# Patient Record
Sex: Male | Born: 1960 | Race: Black or African American | Hispanic: No | Marital: Married | Smoking: Never smoker
Health system: Southern US, Community
[De-identification: ages and names within clinical notes are randomized; demographics above are authoritative.]

---

## 2012-09-30 ENCOUNTER — Observation Stay (HOSPITAL_COMMUNITY): Payer: BC Managed Care – PPO

## 2012-09-30 ENCOUNTER — Emergency Department (HOSPITAL_BASED_OUTPATIENT_CLINIC_OR_DEPARTMENT_OTHER): Payer: BC Managed Care – PPO

## 2012-09-30 ENCOUNTER — Observation Stay (HOSPITAL_BASED_OUTPATIENT_CLINIC_OR_DEPARTMENT_OTHER)
Admission: EM | Admit: 2012-09-30 | Discharge: 2012-10-01 | Disposition: A | Discharge status: Home / Self Care | Payer: BC Managed Care – PPO | Attending: Internal Medicine | Admitting: Internal Medicine

## 2012-09-30 ENCOUNTER — Encounter (HOSPITAL_BASED_OUTPATIENT_CLINIC_OR_DEPARTMENT_OTHER): Payer: Self-pay | Admitting: Emergency Medicine

## 2012-09-30 DIAGNOSIS — S2020XA Contusion of thorax, unspecified, initial encounter: Secondary | ICD-10-CM

## 2012-09-30 DIAGNOSIS — T148XXA Other injury of unspecified body region, initial encounter: Secondary | ICD-10-CM

## 2012-09-30 DIAGNOSIS — S20229S Contusion of unspecified back wall of thorax, sequela: Secondary | ICD-10-CM

## 2012-09-30 DIAGNOSIS — X500XXA Overexertion from strenuous movement or load, initial encounter: Secondary | ICD-10-CM | POA: Insufficient documentation

## 2012-09-30 DIAGNOSIS — R109 Unspecified abdominal pain: Secondary | ICD-10-CM

## 2012-09-30 DIAGNOSIS — S20229A Contusion of unspecified back wall of thorax, initial encounter: Principal | ICD-10-CM | POA: Insufficient documentation

## 2012-09-30 LAB — PROTIME-INR
INR: 1.04 (ref 0.00–1.49)
Prothrombin Time: 13.4 s (ref 11.6–15.2)

## 2012-09-30 LAB — CBC WITH DIFFERENTIAL/PLATELET
Basophils Absolute: 0 10*3/uL (ref 0.0–0.1)
Basophils Relative: 0 % (ref 0–1)
Eosinophils Absolute: 0.5 10*3/uL (ref 0.0–0.7)
Eosinophils Relative: 6 % — ABNORMAL HIGH (ref 0–5)
HCT: 45.5 % (ref 39.0–52.0)
Lymphs Abs: 2 10*3/uL (ref 0.7–4.0)
MCH: 28.7 pg (ref 26.0–34.0)
MCHC: 34.3 g/dL (ref 30.0–36.0)
MCV: 83.8 fL (ref 78.0–100.0)
Monocytes Absolute: 0.8 10*3/uL (ref 0.1–1.0)
Monocytes Relative: 9 % (ref 3–12)
Neutro Abs: 5.5 10*3/uL (ref 1.7–7.7)
Neutrophils Relative %: 62 % (ref 43–77)
RBC: 5.43 MIL/uL (ref 4.22–5.81)
RDW: 12.7 % (ref 11.5–15.5)

## 2012-09-30 LAB — BASIC METABOLIC PANEL
BUN: 13 mg/dL (ref 6–23)
CO2: 26 mEq/L (ref 19–32)
Calcium: 9.3 mg/dL (ref 8.4–10.5)
Creatinine, Ser: 1.1 mg/dL (ref 0.50–1.35)
GFR calc Af Amer: 88 mL/min — ABNORMAL LOW (ref >90)
GFR calc non Af Amer: 76 mL/min — ABNORMAL LOW (ref >90)
Glucose, Bld: 124 mg/dL — ABNORMAL HIGH (ref 70–99)
Potassium: 3.9 mEq/L (ref 3.5–5.1)

## 2012-09-30 LAB — URINALYSIS, ROUTINE W REFLEX MICROSCOPIC
Bilirubin Urine: NEGATIVE
Glucose, UA: NEGATIVE mg/dL
Ketones, ur: NEGATIVE mg/dL
Leukocytes, UA: NEGATIVE
Protein, ur: NEGATIVE mg/dL
Specific Gravity, Urine: 1.022 (ref 1.005–1.030)
pH: 6.5 (ref 5.0–8.0)

## 2012-09-30 LAB — APTT: aPTT: 29 s (ref 24–37)

## 2012-09-30 LAB — HEPATIC FUNCTION PANEL
Alkaline Phosphatase: 101 U/L (ref 39–117)
Bilirubin, Direct: 0.1 mg/dL (ref 0.0–0.3)
Indirect Bilirubin: 0.6 mg/dL (ref 0.3–0.9)
Total Protein: 6.9 g/dL (ref 6.0–8.3)

## 2012-09-30 LAB — LIPASE, BLOOD: Lipase: 15 U/L (ref 11–59)

## 2012-09-30 MED ORDER — CALCIUM CARBONATE ANTACID 500 MG PO CHEW
1.0000 | CHEWABLE_TABLET | Freq: Every day | ORAL | Status: DC
  Administered 2012-09-30 – 2012-10-01 (×2): 200 mg via ORAL
  Filled 2012-09-30 (×2): qty 1

## 2012-09-30 MED ORDER — POLYETHYLENE GLYCOL 3350 17 G PO PACK: 17.0000 g | PACK | Freq: Every day | ORAL | Status: DC | PRN

## 2012-09-30 MED ORDER — ONDANSETRON HCL 4 MG/2ML IJ SOLN: 4.0000 mg | Freq: Four times a day (QID) | INTRAMUSCULAR | Status: DC | PRN

## 2012-09-30 MED ORDER — HYDROMORPHONE HCL PF 1 MG/ML IJ SOLN
1.0000 mg | Freq: Once | INTRAMUSCULAR | Status: AC
  Administered 2012-09-30: 1 mg via INTRAVENOUS
  Filled 2012-09-30: qty 1

## 2012-09-30 MED ORDER — SORBITOL 70 % SOLN
30.0000 mL | Freq: Every day | Status: DC | PRN
  Filled 2012-09-30: qty 30

## 2012-09-30 MED ORDER — SODIUM CHLORIDE 0.9 % IV BOLUS (SEPSIS)
1000.0000 mL | Freq: Once | INTRAVENOUS | Status: AC
  Administered 2012-09-30: 1000 mL via INTRAVENOUS

## 2012-09-30 MED ORDER — MORPHINE SULFATE 2 MG/ML IJ SOLN
2.0000 mg | INTRAMUSCULAR | Status: DC | PRN
  Administered 2012-09-30: 2 mg via INTRAVENOUS
  Filled 2012-09-30: qty 1

## 2012-09-30 MED ORDER — MORPHINE SULFATE 4 MG/ML IJ SOLN
4.0000 mg | Freq: Once | INTRAMUSCULAR | Status: AC
  Administered 2012-09-30: 4 mg via INTRAVENOUS
  Filled 2012-09-30: qty 1

## 2012-09-30 MED ORDER — GADOBENATE DIMEGLUMINE 529 MG/ML IV SOLN
20.0000 mL | Freq: Once | INTRAVENOUS | Status: AC | PRN
  Administered 2012-09-30: 20 mL via INTRAVENOUS

## 2012-09-30 MED ORDER — ACETAMINOPHEN 325 MG PO TABS: 650.0000 mg | ORAL_TABLET | Freq: Four times a day (QID) | ORAL | Status: DC | PRN

## 2012-09-30 MED ORDER — MORPHINE SULFATE 4 MG/ML IJ SOLN
INTRAMUSCULAR | Status: AC
  Filled 2012-09-30: qty 1

## 2012-09-30 MED ORDER — ACETAMINOPHEN 650 MG RE SUPP: 650.0000 mg | Freq: Four times a day (QID) | RECTAL | Status: DC | PRN

## 2012-09-30 MED ORDER — MORPHINE SULFATE 4 MG/ML IJ SOLN
4.0000 mg | Freq: Once | INTRAMUSCULAR | Status: AC
  Administered 2012-09-30: 4 mg via INTRAVENOUS

## 2012-09-30 MED ORDER — IOHEXOL 300 MG/ML  SOLN
80.0000 mL | Freq: Once | INTRAMUSCULAR | Status: AC | PRN
  Administered 2012-09-30: 80 mL via INTRAVENOUS

## 2012-09-30 MED ORDER — ONDANSETRON HCL 4 MG PO TABS: 4.0000 mg | ORAL_TABLET | Freq: Four times a day (QID) | ORAL | Status: DC | PRN

## 2012-09-30 NOTE — ED Provider Notes (Signed)
CSN: [id number]     Arrival date & time 09/30/12  0125 History   First MD Initiated Contact with Patient 09/30/12 0159     Chief Complaint  Patient presents with  . Abdominal Pain   (Consider location/radiation/quality/duration/timing/severity/associated sxs/prior Treatment) Patient is a 52 y.o. male presenting with abdominal pain. The history is provided by the patient.  Abdominal Pain Pain location:  Generalized Pain quality: aching   Pain radiates to:  Does not radiate Pain severity:  Severe Onset quality:  Gradual Duration:  1 week Progression:  Worsening Context: not alcohol use and not eating   Relieved by:  Nothing Worsened by:  Nothing tried Ineffective treatments:  None tried Associated symptoms: no anorexia, no chest pain, no constipation, no diarrhea and no vomiting   Risk factors: no recent hospitalization     History reviewed. No pertinent past medical history. History reviewed. No pertinent past surgical history. History reviewed. No pertinent family history. History  Substance Use Topics  . Smoking status: Never Smoker   . Smokeless tobacco: Not on file  . Alcohol Use: No    Review of Systems  Cardiovascular: Negative for chest pain.  Gastrointestinal: Positive for abdominal pain. Negative for vomiting, diarrhea, constipation and anorexia.  Genitourinary: Positive for flank pain.  All other systems reviewed and are negative.    Allergies  Review of patient's allergies indicates no known allergies.  Home Medications   Current Outpatient Rx  Name  Route  Sig  Dispense  Refill  . calcium carbonate (TUMS - DOSED IN MG ELEMENTAL CALCIUM) 500 MG chewable tablet   Oral   Chew 1 tablet by mouth daily.          BP 111/91  Pulse 89  Temp(Src) 98.7 F (37.1 C) (Oral)  Resp 20  Ht 5\' 10"  (1.778 m)  Wt 192 lb (87.091 kg)  BMI 27.55 kg/m2  SpO2 100% Physical Exam  Constitutional: He is oriented to person, place, and time. He appears well-developed  and well-nourished. No distress.  HENT:  Head: Normocephalic and atraumatic.  Mouth/Throat: Oropharynx is clear and moist.  Eyes: Conjunctivae are normal. Pupils are equal, round, and reactive to light.  Neck: Normal range of motion. Neck supple.  Cardiovascular: Normal rate, regular rhythm and intact distal pulses.   Pulmonary/Chest: Effort normal and breath sounds normal. No respiratory distress. He has no wheezes. He has no rales.  Abdominal: Soft. Bowel sounds are normal. He exhibits distension. There is tenderness. There is no rebound and no guarding.  Musculoskeletal: Normal range of motion.  Neurological: He is alert and oriented to person, place, and time.  Skin: Skin is warm and dry.  Psychiatric: He has a normal mood and affect.    ED Course  Procedures (including critical care time) Labs Review Labs Reviewed  URINALYSIS, ROUTINE W REFLEX MICROSCOPIC - Abnormal; Notable for the following:    APPearance CLOUDY (*)    All other components within normal limits  CBC WITH DIFFERENTIAL - Abnormal; Notable for the following:    Eosinophils Relative 6 (*)    All other components within normal limits  BASIC METABOLIC PANEL - Abnormal; Notable for the following:    Glucose, Bld 124 (*)    GFR calc non Af Amer 76 (*)    GFR calc Af Amer 88 (*)    All other components within normal limits  HEPATIC FUNCTION PANEL  LIPASE, BLOOD   Imaging Review Ct Abdomen Pelvis Wo Contrast  09/30/2012   CLINICAL DATA:  Right abdominal pain.  EXAM: CT ABDOMEN AND PELVIS WITHOUT CONTRAST  TECHNIQUE: Multidetector CT imaging of the abdomen and pelvis was performed following the standard protocol without intravenous contrast.  COMPARISON:  None.  FINDINGS: Dependent atelectasis posteriorly in the right lung base. Abnormal hyperdense soft tissue anterior to all levels of the visualized lower thoracic spine extending into the retrocrural region of the upper abdomen. There is a minimal usual anterior  spurring in the lower thoracic spine without evident disk pathology or other etiology of the above findings.  Unremarkable nonenhanced assessment of liver, spleen, adrenal glands, kidneys, pancreas, abdominal aorta. Stomach, small bowel, and colon unremarkable, nondilated. Normal appendix. Urinary bladder is nondistended. There is mild prostatic enlargement. There is lobular enlargement of the seminal vesicles right greater than left. Bilateral pelvic phleboliths. No ascites. No free air.  IMPRESSION: Extensive hyperdense thoracic prevertebral material possibly hematoma. Abscess, lymphoma, or TB considered less likely. Recommend CT chest with contrast to evaluate the complete extent of this process.   Electronically Signed   By: Oley Balm M.D.   On: 09/30/2012 03:35    MDM  No diagnosis found.  Thoracic hematoma  Case d/w Dr. Dorris Fetch, CVTS. admit to medicine.    Jasmine Awe, MD 09/30/12 [id number]

## 2012-09-30 NOTE — H&P (Signed)
[facility] History and Physical  Eric Hubbard ZOX:[id number] DOB: 1960-07-13 DOA: 09/30/2012  Referring physician: Dr. Nicanor Alcon PCP: Lester Cobre, MD  Specialists:   Chief Complaint: Abdominal pain  HPI: Eric Hubbard is a 52 y.o. male  With no significant pmh who initially presented to [facility] with complaints of abdominal pain. During his work up, the patient was noted to have CT findings suggestive of a thoracic hematoma extending from T3 through T12. He was subsequently transferred to [facility] for further evaluation. On further questioning, the patient recalled hurting his lower back while lifting an 80 lb equipment by himself at work 2 weeks ago. Lower back pain has since resolved. Pt also regularly participates in resistance training.  Review of Systems:  Per above, remainder of 10pt ros reviewed and are neg  History reviewed. No pertinent past medical history. History reviewed. No pertinent past surgical history. Social History:  reports that he has never smoked. He does not have any smokeless tobacco history on file. He reports that he does not drink alcohol. His drug history is not on file.  where does patient live--home, ALF, SNF? and with whom if at home?  Can patient participate in ADLs?  No Known Allergies  History reviewed. No pertinent family history.  (be sure to complete)  Prior to Admission medications   Medication Sig Start Date End Date Taking? Authorizing Provider  calcium carbonate (TUMS - DOSED IN MG ELEMENTAL CALCIUM) 500 MG chewable tablet Chew 1 tablet by mouth daily.   Yes Historical Provider, MD   Physical Exam: Filed Vitals:   09/30/12 0132 09/30/12 0624  BP: 111/91 126/81  Pulse: 89 87  Temp: 98.7 F (37.1 C) 97.9 F (36.6 C)  TempSrc: Oral Oral  Resp: 20 18  Height: 5\' 10"  (1.778 m)   Weight: 87.091 kg (192 lb)   SpO2: 100% 100%     General:  Awake, in nad  Eyes: PERRL B  ENT: membranes moist, dentition fair  Neck: trachea  midline, neck supple  Cardiovascular: regular, s1, s2  Respiratory: normal resp effort, no wheezing  Abdomen: soft, nondistended  Skin: normal skin turgor, no abnormal skin lesions seen  Musculoskeletal: perfused, no clubbing  Psychiatric: mood/affect normal // no auditory/visual hallucinations  Neurologic: cn2-12 grossly intact, strength/sensation intact  Labs on Admission:  Basic Metabolic Panel:  Recent Labs Lab 09/30/12 0155  NA 139  K 3.9  CL 105  CO2 26  GLUCOSE 124*  BUN 13  CREATININE 1.10  CALCIUM 9.3   Liver Function Tests:  Recent Labs Lab 09/30/12 0155  AST 13  ALT 14  ALKPHOS 101  BILITOT 0.7  PROT 6.9  ALBUMIN 3.7    Recent Labs Lab 09/30/12 0155  LIPASE 15   No results found for this basename: AMMONIA,  in the last 168 hours CBC:  Recent Labs Lab 09/30/12 0155  WBC 8.8  NEUTROABS 5.5  HGB 15.6  HCT 45.5  MCV 83.8  PLT 218   Cardiac Enzymes: No results found for this basename: CKTOTAL, CKMB, CKMBINDEX, TROPONINI,  in the last 168 hours  BNP (last 3 results) No results found for this basename: PROBNP,  in the last 8760 hours CBG: No results found for this basename: GLUCAP,  in the last 168 hours  Radiological Exams on Admission: Ct Abdomen Pelvis Wo Contrast  09/30/2012   CLINICAL DATA:  Right abdominal pain.  EXAM: CT ABDOMEN AND PELVIS WITHOUT CONTRAST  TECHNIQUE: Multidetector CT imaging of the abdomen and pelvis was  performed following the standard protocol without intravenous contrast.  COMPARISON:  None.  FINDINGS: Dependent atelectasis posteriorly in the right lung base. Abnormal hyperdense soft tissue anterior to all levels of the visualized lower thoracic spine extending into the retrocrural region of the upper abdomen. There is a minimal usual anterior spurring in the lower thoracic spine without evident disk pathology or other etiology of the above findings.  Unremarkable nonenhanced assessment of liver, spleen, adrenal  glands, kidneys, pancreas, abdominal aorta. Stomach, small bowel, and colon unremarkable, nondilated. Normal appendix. Urinary bladder is nondistended. There is mild prostatic enlargement. There is lobular enlargement of the seminal vesicles right greater than left. Bilateral pelvic phleboliths. No ascites. No free air.  IMPRESSION: Extensive hyperdense thoracic prevertebral material possibly hematoma. Abscess, lymphoma, or TB considered less likely. Recommend CT chest with contrast to evaluate the complete extent of this process.   Electronically Signed   By: Oley Balm M.D.   On: 09/30/2012 03:35   Ct Chest W Contrast  09/30/2012   CLINICAL DATA:  Right-sided abdominal pain. Prevertebral soft tissue density on abdominal scan, incompletely visualized.  EXAM: CT CHEST WITH CONTRAST  TECHNIQUE: Multidetector CT imaging of the chest was performed during intravenous contrast administration.  CONTRAST:  80 mL Omnipaque 300 IV  COMPARISON:  Abdomen scan from earlier the same day  FINDINGS: Abnormal prevertebral soft tissue attenuation material extends from the T3 level to approximately T10, and retrocrural extension is seen to the T12 level. The collection is centered at the T6-7 level, measuring approximately 18 mm in thickness anterior to the interspace.  Adequate contrast opacification of the thoracic aorta with no evidence of dissection, aneurysm, or stenosis. There is classic 3-vessel brachiocephalic arch anatomy without proximal stenosis. No significant atheromatous plaque or penetrating ulcer. No evidence of branch vessel aneurysm or active extravasation.  Thoracic discs are normal in height throughout. There are some small anterior endplate spurs in the mid thoracic spine. No thoracic vertebral lesion is evident. Esophagus appears normal in caliber, nondistended, although its margins are difficult to as delineated at the level of the collection.  Subcentimeter prevascular and precarinal lymph nodes. No  hilar adenopathy. There is a trace left pleural effusion. Minimal dependent atelectasis posteriorly in both lower lobes. Lungs otherwise clear. An 18 mm low-attenuation right thyroid nodule or cyst is incidentally noted.  IMPRESSION: 1. High attenuation thoracic prevertebral collection extending from T3-T12, probably hematoma, without associated vascular lesion, mass, or other evident etiology. Lymphoma, extramedullary hematopoiesis, and infectious/inflammatory phlegmon are less likely differential considerations. At minimum, consider followup to confirm appropriate resolution. MR thoracic spine with contrast may be useful for further characterization and to exclude underlying mass as source of hemorrhage.   Electronically Signed   By: Oley Balm M.D.   On: 09/30/2012 04:35     Assessment/Plan Principal Problem:   Traumatic hematoma of thoracic region Active Problems:   Abdominal  pain, other specified site   1. Suspected hematoma along T3-T12 1. Will order follow up MRI thoracic spine 2. Will check PTT and INR 3. Plts and cbc normal 4. Additional w/u pending MRI result 5. Admit to med-surg 2. Abdominal pain 1. None currently 2. No obstruction on CT 3. Will order diet for now 3. DVT prophylaxis 1. SCD's given ? Of hematoma on CT  Code Status: Full (must indicate code status--if unknown or must be presumed, indicate so) Family Communication: Pt in room (indicate person spoken with, if applicable, with phone number if by telephone) Disposition Plan: Pending (  indicate anticipated LOS)  Time spent:  CHIU, STEPHEN K [facility] Pager [phone]  If 7PM-7AM, please contact night-coverage [email or web address] Password [facility] 09/30/2012, 10:13 AM

## 2012-09-30 NOTE — Progress Notes (Signed)
PENDING ACCEPTANCE TRANFER NOTE:  Call received from:  Dr Nicanor Alcon of [facility].  REASON FOR REQUESTING TRANSFER:  For further work up of abnormal chest CT.  HPI: 52 yo male presents to [facility] with severe abdominal pain.  CT of the abdomen showed prevertebral hyperdense finding leading to chest CT which showed hyperdense lesion in the prevertebral area from T3 to T12 suspicious for hematoma, though could be others.  He is not short of breath, having fever or chills, chest or back pain.  She questioned if his abdominal pain is the referred pain.  MR of the spine may elucidate this.  She spoke with thoracic surgery who didn't think any surgical intervention is required.  This patient will need an MRI to look at this area.  He is hemodynamically stable.  PLAN:  According to telephone report, this patient was accepted for transfer to [facility] general medical floor,   Under [facility] team: 10,  I have requested an order be written to call Flow Manager at [phone] upon patient arrival to the floor for final physician assignment who will do the admission and give admitting orders.    SIGNEDHouston Siren, MD [facility]  09/30/2012, 6:21 AM

## 2012-09-30 NOTE — ED Notes (Signed)
Pt reports sudden onset right side abdominal pain that radiates to right flank, started around 1100pm, eased up then returned

## 2012-10-01 DIAGNOSIS — IMO0002 Reserved for concepts with insufficient information to code with codable children: Secondary | ICD-10-CM

## 2012-10-01 LAB — BASIC METABOLIC PANEL
BUN: 10 mg/dL (ref 6–23)
Chloride: 103 mEq/L (ref 96–112)
GFR calc Af Amer: 89 mL/min — ABNORMAL LOW (ref >90)
GFR calc non Af Amer: 77 mL/min — ABNORMAL LOW (ref >90)
Glucose, Bld: 95 mg/dL (ref 70–99)
Potassium: 4.1 mEq/L (ref 3.5–5.1)
Sodium: 135 mEq/L (ref 135–145)

## 2012-10-01 LAB — CBC
HCT: 42.1 % (ref 39.0–52.0)
Hemoglobin: 14.7 g/dL (ref 13.0–17.0)
Platelets: 214 10*3/uL (ref 150–400)
RBC: 5.03 MIL/uL (ref 4.22–5.81)
WBC: 11.8 10*3/uL — ABNORMAL HIGH (ref 4.0–10.5)

## 2012-10-01 MED ORDER — POLYETHYLENE GLYCOL 3350 17 G PO PACK: 17.0000 g | PACK | Freq: Every day | ORAL | Status: AC | PRN

## 2012-10-01 NOTE — Discharge Summary (Addendum)
Physician Discharge Summary  Eric Hubbard:[id number] DOB: February 23, 1960 DOA: 09/30/2012  PCP: Lester [place], MD  Admit date: 09/30/2012 Discharge date: 10/01/2012  Time spent: 35 minutes  Recommendations for Outpatient Follow-up:  1. Avoid straining/heavy lifting 2. Will need repeat CT scan of chest to ensure resolution of hematoma  Discharge Diagnoses:  Principal Problem:   Traumatic hematoma of thoracic region Active Problems:   Abdominal  pain, other specified site   Discharge Condition: improved  Diet recommendation: regular  Filed Weights   09/30/12 0132  Weight: 87.091 kg (192 lb)    History of present illness:  Eric Hubbard is a 52 y.o. male  With no significant pmh who initially presented to [facility] with complaints of abdominal pain. During his work up, the patient was noted to have CT findings suggestive of a thoracic hematoma extending from T3 through T12. He was subsequently transferred to [facility] for further evaluation. On further questioning, the patient recalled hurting his lower back while lifting an 80 lb equipment by himself at work 2 weeks ago. Lower back pain has since resolved. Pt also regularly participates in resistance training   Hospital Course:  Suspected hematoma along T3-T12  MRI confirmed, and CTA was done that did not show bleeding- most likely from straining after heavy weight lifting  Procedures:  none  Consultations:  Phone consult- CV surgery  Discharge Exam: Filed Vitals:   10/01/12 0602  BP: 155/70  Pulse: 84  Temp: 97.9 F (36.6 C)  Resp: 16    General: A+Ox3, NAD Cardiovascular: rrr Respiratory: clear anterior  Discharge Instructions      Discharge Orders   Future Orders Complete By Expires   Diet - low sodium heart healthy  As directed    Discharge instructions  As directed    Comments:     Can take OTC zantac/prilosec for symptoms of heart burn Can take gas-x for bloating/gas Avoid heavy lifting until seen by  PCP avoid straining   Increase activity slowly  As directed        Medication List         calcium carbonate 500 MG chewable tablet  Commonly known as:  TUMS - dosed in mg elemental calcium  Chew 2 tablets by mouth as needed for heartburn.     polyethylene glycol packet  Commonly known as:  MIRALAX / GLYCOLAX  Take 17 g by mouth daily as needed.       No Known Allergies    The results of significant diagnostics from this hospitalization (including imaging, microbiology, ancillary and laboratory) are listed below for reference.    Significant Diagnostic Studies: Ct Abdomen Pelvis Wo Contrast  09/30/2012   CLINICAL DATA:  Right abdominal pain.  EXAM: CT ABDOMEN AND PELVIS WITHOUT CONTRAST  TECHNIQUE: Multidetector CT imaging of the abdomen and pelvis was performed following the standard protocol without intravenous contrast.  COMPARISON:  None.  FINDINGS: Dependent atelectasis posteriorly in the right lung base. Abnormal hyperdense soft tissue anterior to all levels of the visualized lower thoracic spine extending into the retrocrural region of the upper abdomen. There is a minimal usual anterior spurring in the lower thoracic spine without evident disk pathology or other etiology of the above findings.  Unremarkable nonenhanced assessment of liver, spleen, adrenal glands, kidneys, pancreas, abdominal aorta. Stomach, small bowel, and colon unremarkable, nondilated. Normal appendix. Urinary bladder is nondistended. There is mild prostatic enlargement. There is lobular enlargement of the seminal vesicles right greater than left. Bilateral pelvic phleboliths. No  ascites. No free air.  IMPRESSION: Extensive hyperdense thoracic prevertebral material possibly hematoma. Abscess, lymphoma, or TB considered less likely. Recommend CT chest with contrast to evaluate the complete extent of this process.   Electronically Signed   By: Oley Balm M.D.   On: 09/30/2012 03:35   Ct Chest W  Contrast  09/30/2012   CLINICAL DATA:  Right-sided abdominal pain. Prevertebral soft tissue density on abdominal scan, incompletely visualized.  EXAM: CT CHEST WITH CONTRAST  TECHNIQUE: Multidetector CT imaging of the chest was performed during intravenous contrast administration.  CONTRAST:  80 mL Omnipaque 300 IV  COMPARISON:  Abdomen scan from earlier the same day  FINDINGS: Abnormal prevertebral soft tissue attenuation material extends from the T3 level to approximately T10, and retrocrural extension is seen to the T12 level. The collection is centered at the T6-7 level, measuring approximately 18 mm in thickness anterior to the interspace.  Adequate contrast opacification of the thoracic aorta with no evidence of dissection, aneurysm, or stenosis. There is classic 3-vessel brachiocephalic arch anatomy without proximal stenosis. No significant atheromatous plaque or penetrating ulcer. No evidence of branch vessel aneurysm or active extravasation.  Thoracic discs are normal in height throughout. There are some small anterior endplate spurs in the mid thoracic spine. No thoracic vertebral lesion is evident. Esophagus appears normal in caliber, nondistended, although its margins are difficult to as delineated at the level of the collection.  Subcentimeter prevascular and precarinal lymph nodes. No hilar adenopathy. There is a trace left pleural effusion. Minimal dependent atelectasis posteriorly in both lower lobes. Lungs otherwise clear. An 18 mm low-attenuation right thyroid nodule or cyst is incidentally noted.  IMPRESSION: 1. High attenuation thoracic prevertebral collection extending from T3-T12, probably hematoma, without associated vascular lesion, mass, or other evident etiology. Lymphoma, extramedullary hematopoiesis, and infectious/inflammatory phlegmon are less likely differential considerations. At minimum, consider followup to confirm appropriate resolution. MR thoracic spine with contrast may be  useful for further characterization and to exclude underlying mass as source of hemorrhage.   Electronically Signed   By: Oley Balm M.D.   On: 09/30/2012 04:35   Mr Thoracic Spine W Wo Contrast  09/30/2012   *RADIOLOGY REPORT*  Clinical Data: Acute posterior mediastinal hemorrhage.  MRI THORACIC SPINE WITHOUT AND WITH CONTRAST  Technique:  Multiplanar and multiecho pulse sequences of the thoracic spine were obtained without and with intravenous contrast.  Contrast: 20mL MULTIHANCE GADOBENATE DIMEGLUMINE 529 MG/ML IV SOLN  Comparison: CT scans of the abdomen and pelvis dated 09/30/2012  Findings: The patient has what appears to be an extensive posterior mediastinal hematoma extending to the right and left and anterior to the thoracic spine from T3 to at least L1.  The thoracic spine appears normal.  There is no extension of the hemorrhage into the spinal canal.  Thoracic spinal cord is normal.  There is no diskitis or osteomyelitis.  The descending thoracic aorta appears normal as does the esophagus. The hemorrhage is deep to the azygos and hemiazygous veins.  There is a 3 x 1.5 cm clot between the thoracic aorta and the thoracic spine visible on image number 27 of series 8 and this clot is surrounded by prominent veins and arteries.  I do not see a specific bronchial artery aneurysm or other vascular malformation.  The small left pleural effusion has slightly increased in size since the prior CT scan of the chest and patient now has a tiny right effusion with slight atelectasis in the right lower lobe. After  contrast administration there is no appreciable enhancing mass lesion.  IMPRESSION: 1.  Posterior mediastinal hemorrhage of unknown etiology.  I suspect the patient has had a spontaneous arterial or venous bleed perhaps from a bronchial artery or vein secondary to Valsalva maneuver. 2.  I recommend a CT angiogram of the descending thoracic aorta to look for a small vascular malformation or ruptured  bronchial artery aneurysm.  Critical Value/emergent results were called by telephone at the time of interpretation on 09/30/2012 at 6:00 p.m. to Dr. Red Christians, who verbally acknowledged these results.   Original Report Authenticated By: Francene Boyers, M.D.   Ct Angio Chest Aorta W/cm &/or Wo/cm  09/30/2012   CLINICAL DATA:  Posterior mediastinal hemorrhage  EXAM: CT ANGIOGRAPHY CHEST  TECHNIQUE: Multidetector CT imaging through the chest was performed using the standard protocol during bolus administration of intravenous contrast. Multiplanar reconstructed images including MIPs were obtained and reviewed to evaluate the vascular anatomy.  CONTRAST:  80mL OMNIPAQUE IOHEXOL 300 MG/ML  SOLN  COMPARISON:  Earlier studies of the same day  FINDINGS: Thoracic aorta normal in caliber and contour without significant atheromatous irregularity, dissection, aneurysm, or stenosis. Classic 3 vessel brachiocephalic artery origin anatomy without proximal stenosis. Normal-sized intercostal vessels are identified bilaterally at T4-T11 levels with no evidence of pseudoaneurysm, AV fistula, active extravasation, or other evident etiology of the posterior mediastinal/prevertebral hemorrhage. No enlarged bronchial arteries. No early draining veins to suggest AVM.  Venous phase shows patency of the azygos venous system. The size of the prevertebral/ posterior mediastinal hemorrhage is stable. There are tiny bilateral pleural effusions which are slightly increased.  No pericardial effusion. Subcentimeter prevascular and precarinal lymph nodes stable. Stable low-attenuation right thyroid and left chest neck lesions. Dependent atelectasis posteriorly in the lower lobes. Lungs otherwise clear. Minimal anterior spurring in the mid thoracic spine. No acute bone lesion. Thoracic component esophagus is decompressed without evident wall thickening or mass, displaced anteriorly by with a hematoma.  Review of the MIP images confirms the above  findings.  : IMPRESSION:  No lesion of the thoracic aorta or branch vessels to suggest etiology of the posterior mediastinal/prevertebral hematoma.  Slight increase in small low attenuation pleural effusions since previous scan.   Electronically Signed   By: Oley Balm M.D.   On: 09/30/2012 21:24    Microbiology: No results found for this or any previous visit (from the past 240 hour(s)).   Labs: Basic Metabolic Panel:  Recent Labs Lab 09/30/12 0155 10/01/12 0500  NA 139 135  K 3.9 4.1  CL 105 103  CO2 26 24  GLUCOSE 124* 95  BUN 13 10  CREATININE 1.10 1.09  CALCIUM 9.3 8.8   Liver Function Tests:  Recent Labs Lab 09/30/12 0155  AST 13  ALT 14  ALKPHOS 101  BILITOT 0.7  PROT 6.9  ALBUMIN 3.7    Recent Labs Lab 09/30/12 0155  LIPASE 15   No results found for this basename: AMMONIA,  in the last 168 hours CBC:  Recent Labs Lab 09/30/12 0155 10/01/12 0500  WBC 8.8 11.8*  NEUTROABS 5.5  --   HGB 15.6 14.7  HCT 45.5 42.1  MCV 83.8 83.7  PLT 218 214   Cardiac Enzymes: No results found for this basename: CKTOTAL, CKMB, CKMBINDEX, TROPONINI,  in the last 168 hours BNP: BNP (last 3 results) No results found for this basename: PROBNP,  in the last 8760 hours CBG: No results found for this basename: GLUCAP,  in the last 168 hours  SignedMarlin Canary  [facility] 10/01/2012, 12:26 PM

## 2015-01-18 IMAGING — CT CT ANGIO CHEST
2 of 10 series · 13 of 46 positions shown · IV contrast (omnipaque)
Comparison: Earlier studies of the same day

CLINICAL DATA: Posterior mediastinal hemorrhage

EXAM:
CT ANGIOGRAPHY CHEST
TECHNIQUE: Multidetector CT imaging through the chest was performed using the
standard protocol during bolus administration of intravenous
contrast. Multiplanar reconstructed images including MIPs were
obtained and reviewed to evaluate the vascular anatomy.
CONTRAST:  80mL OMNIPAQUE IOHEXOL 300 MG/ML  SOLN

[Series 5: dissection 2.0 i30f 3 · axial · 0.70mm/px · z∈[+641,+879]mm · 10 of 147 slices shown]
[im 14/147  lung]
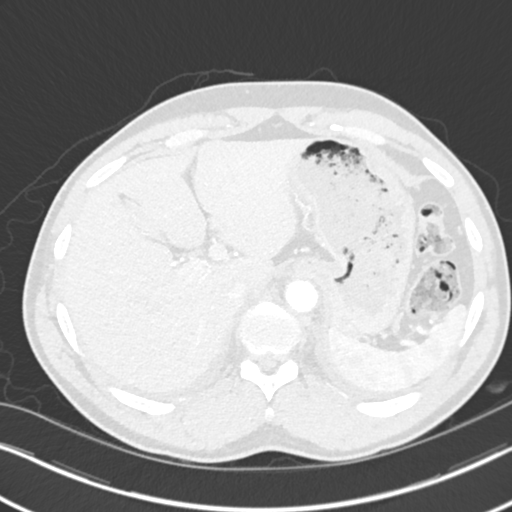
[im 27/147  soft-tissue]
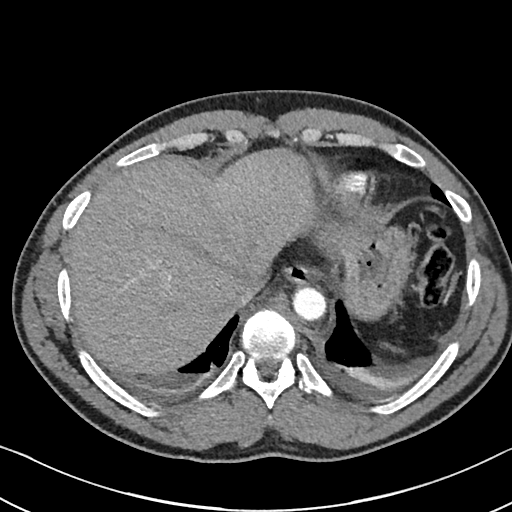
[im 40/147  lung]
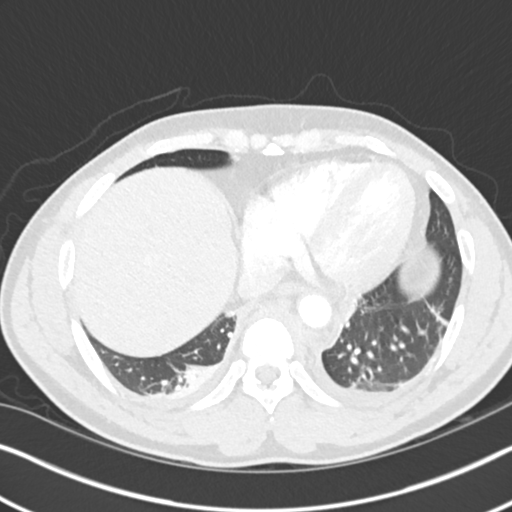
[im 54/147  soft-tissue]
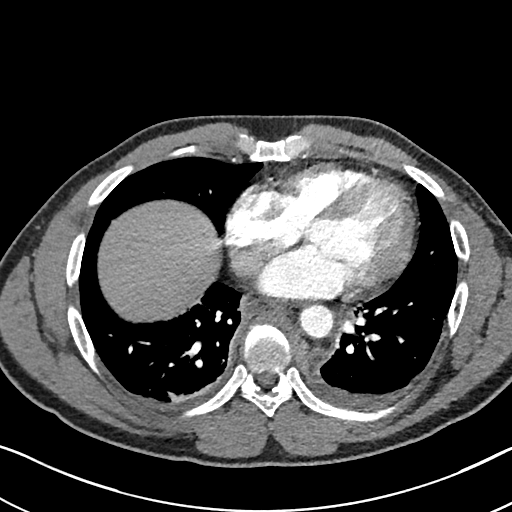
[im 67/147  lung]
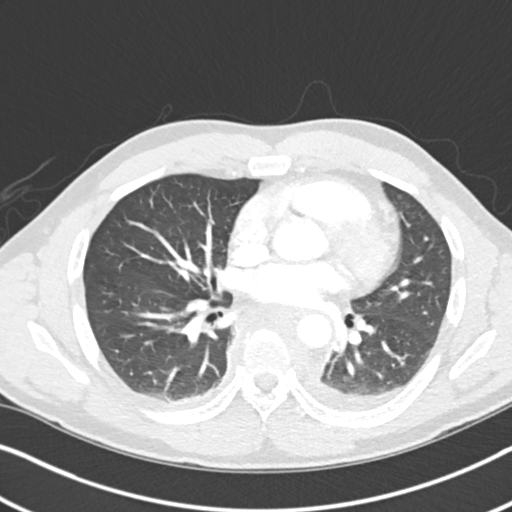
[im 80/147  soft-tissue]
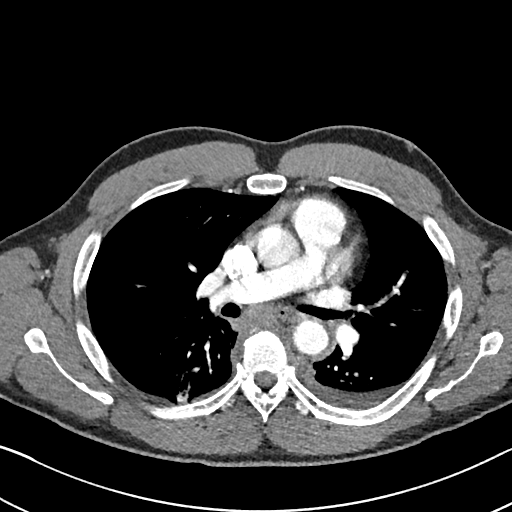
[im 93/147  lung]
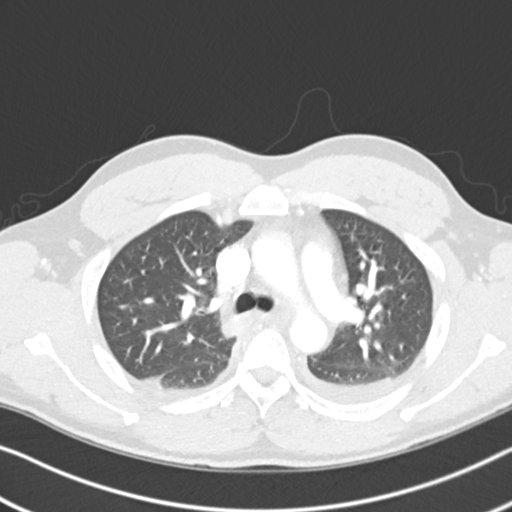
[im 107/147  soft-tissue]
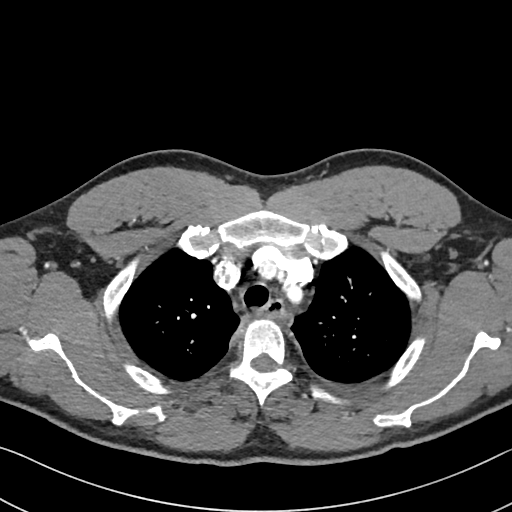
[im 120/147  lung]
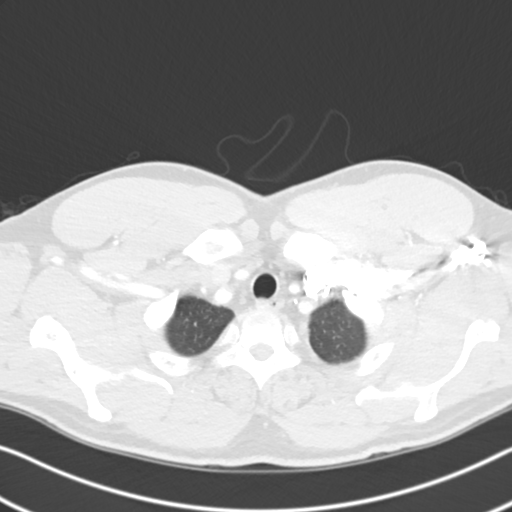
[im 133/147  soft-tissue]
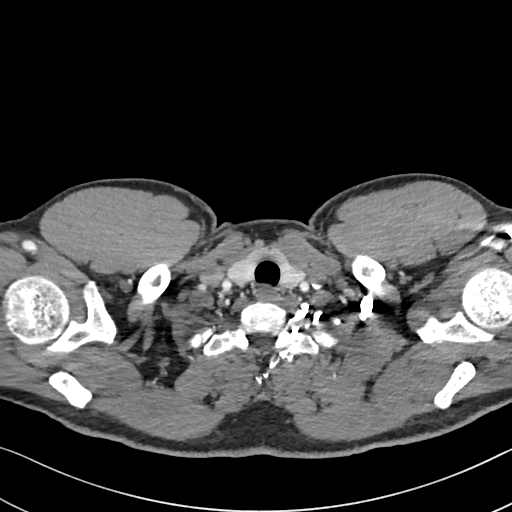

[Series 8: coronal mpr · coronal · 0.57mm/px · 3 of 151 slices shown]
[im 38/151  soft-tissue]
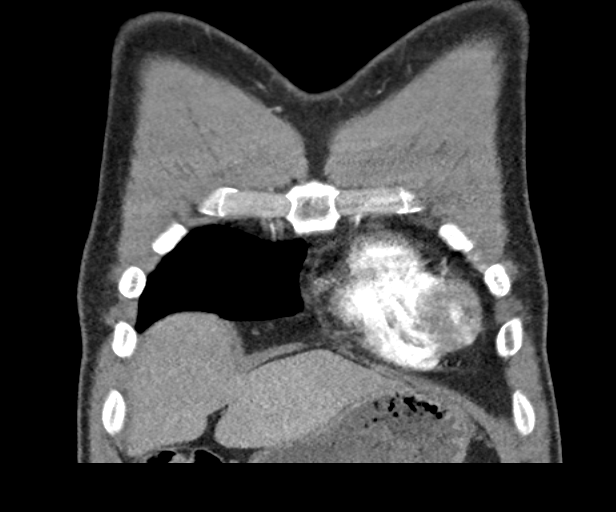
[im 76/151  soft-tissue]
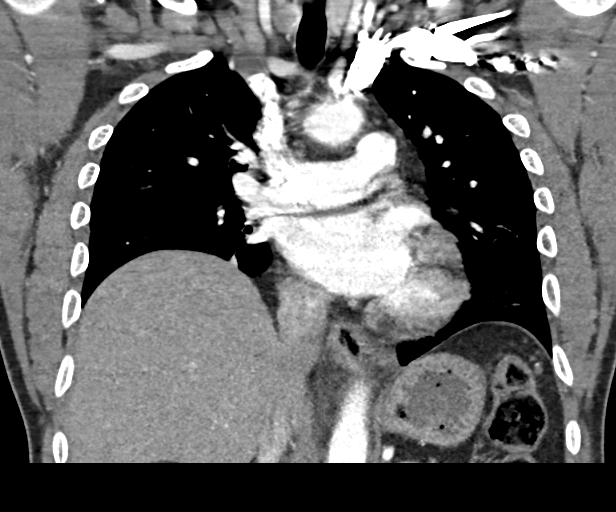
[im 113/151  soft-tissue]
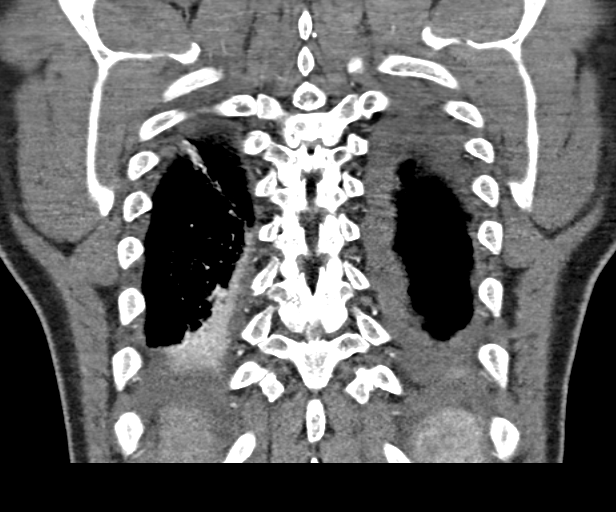

[13 of 46 positions shown; findings below may reference images not displayed]

FINDINGS: Thoracic aorta normal in caliber and contour without significant
atheromatous irregularity, dissection, aneurysm, or stenosis.
Classic 3 vessel brachiocephalic artery origin anatomy without
proximal stenosis. Normal-sized intercostal vessels are identified
bilaterally at T4-T11 levels with no evidence of pseudoaneurysm, AV
fistula, active extravasation, or other evident etiology of the
posterior mediastinal/prevertebral hemorrhage. No enlarged bronchial
arteries. No early draining veins to suggest AVM.

Venous phase shows patency of the azygos venous system. The size of
the prevertebral/ posterior mediastinal hemorrhage is stable. There
are tiny bilateral pleural effusions which are slightly increased.

No pericardial effusion. Subcentimeter prevascular and precarinal
lymph nodes stable. Stable low-attenuation right thyroid and left
chest neck lesions. Dependent atelectasis posteriorly in the lower
lobes. Lungs otherwise clear. Minimal anterior spurring in the mid
thoracic spine. No acute bone lesion. Thoracic component esophagus
is decompressed without evident wall thickening or mass, displaced
anteriorly by with a hematoma.

Review of the MIP images confirms the above findings.

:
IMPRESSION: No lesion of the thoracic aorta or branch vessels to suggest
etiology of the posterior mediastinal/prevertebral hematoma.

Slight increase in small low attenuation pleural effusions since
previous scan.

## 2019-08-22 ENCOUNTER — Other Ambulatory Visit: Payer: Self-pay

## 2019-08-22 ENCOUNTER — Telehealth: Payer: Self-pay | Admitting: Nurse Practitioner

## 2019-08-22 ENCOUNTER — Emergency Department (HOSPITAL_BASED_OUTPATIENT_CLINIC_OR_DEPARTMENT_OTHER): Payer: BC Managed Care – PPO

## 2019-08-22 ENCOUNTER — Emergency Department (HOSPITAL_BASED_OUTPATIENT_CLINIC_OR_DEPARTMENT_OTHER)
Admission: EM | Admit: 2019-08-22 | Discharge: 2019-08-22 | Disposition: A | Discharge status: Home / Self Care | Payer: BC Managed Care – PPO | Attending: Emergency Medicine | Admitting: Emergency Medicine

## 2019-08-22 ENCOUNTER — Encounter (HOSPITAL_BASED_OUTPATIENT_CLINIC_OR_DEPARTMENT_OTHER): Payer: Self-pay

## 2019-08-22 DIAGNOSIS — U071 COVID-19: Secondary | ICD-10-CM | POA: Diagnosis not present

## 2019-08-22 DIAGNOSIS — J1282 Pneumonia due to coronavirus disease 2019: Secondary | ICD-10-CM | POA: Diagnosis not present

## 2019-08-22 DIAGNOSIS — R05 Cough: Secondary | ICD-10-CM | POA: Diagnosis present

## 2019-08-22 LAB — URINALYSIS, ROUTINE W REFLEX MICROSCOPIC
Bilirubin Urine: NEGATIVE
Glucose, UA: NEGATIVE mg/dL
Ketones, ur: NEGATIVE mg/dL
Leukocytes,Ua: NEGATIVE
Nitrite: NEGATIVE
Protein, ur: 100 mg/dL — AB
Specific Gravity, Urine: >1.03 — ABNORMAL HIGH (ref 1.005–1.030)
pH: 5.5 (ref 5.0–8.0)

## 2019-08-22 LAB — COMPREHENSIVE METABOLIC PANEL
ALT: 38 U/L (ref 0–44)
AST: 36 U/L (ref 15–41)
Albumin: 3.1 g/dL — ABNORMAL LOW (ref 3.5–5.0)
Alkaline Phosphatase: 81 U/L (ref 38–126)
Anion gap: 8 (ref 5–15)
BUN: 13 mg/dL (ref 6–20)
CO2: 26 mmol/L (ref 22–32)
Calcium: 8.1 mg/dL — ABNORMAL LOW (ref 8.9–10.3)
Chloride: 96 mmol/L — ABNORMAL LOW (ref 98–111)
Creatinine, Ser: 0.96 mg/dL (ref 0.61–1.24)
GFR calc Af Amer: >60 mL/min (ref >60)
GFR calc non Af Amer: >60 mL/min (ref >60)
Glucose, Bld: 118 mg/dL — ABNORMAL HIGH (ref 70–99)
Potassium: 4.5 mmol/L (ref 3.5–5.1)
Sodium: 130 mmol/L — ABNORMAL LOW (ref 135–145)
Total Bilirubin: 0.8 mg/dL (ref 0.3–1.2)
Total Protein: 6.5 g/dL (ref 6.5–8.1)

## 2019-08-22 LAB — CBC WITH DIFFERENTIAL/PLATELET
Abs Immature Granulocytes: 0.04 10*3/uL (ref 0.00–0.07)
Basophils Absolute: 0 10*3/uL (ref 0.0–0.1)
Basophils Relative: 0 %
Eosinophils Absolute: 0 10*3/uL (ref 0.0–0.5)
Eosinophils Relative: 0 %
HCT: 48 % (ref 39.0–52.0)
Hemoglobin: 16 g/dL (ref 13.0–17.0)
Immature Granulocytes: 0 %
Lymphocytes Relative: 10 %
Lymphs Abs: 0.9 10*3/uL (ref 0.7–4.0)
MCH: 28.1 pg (ref 26.0–34.0)
MCHC: 33.3 g/dL (ref 30.0–36.0)
MCV: 84.2 fL (ref 80.0–100.0)
Monocytes Absolute: 1.1 10*3/uL — ABNORMAL HIGH (ref 0.1–1.0)
Monocytes Relative: 12 %
Neutro Abs: 7.2 10*3/uL (ref 1.7–7.7)
Neutrophils Relative %: 78 %
Platelets: 222 10*3/uL (ref 150–400)
RBC: 5.7 MIL/uL (ref 4.22–5.81)
RDW: 12.6 % (ref 11.5–15.5)
WBC: 9.2 10*3/uL (ref 4.0–10.5)
nRBC: 0 % (ref 0.0–0.2)

## 2019-08-22 LAB — URINALYSIS, MICROSCOPIC (REFLEX)

## 2019-08-22 LAB — SARS CORONAVIRUS 2 BY RT PCR (HOSPITAL ORDER, PERFORMED IN ~~LOC~~ HOSPITAL LAB): SARS Coronavirus 2: POSITIVE — AB

## 2019-08-22 MED ORDER — SODIUM CHLORIDE 0.9 % IV BOLUS
1000.0000 mL | Freq: Once | INTRAVENOUS | Status: AC
  Administered 2019-08-22: 13:00:00 1000 mL via INTRAVENOUS

## 2019-08-22 MED ORDER — KETOROLAC TROMETHAMINE 30 MG/ML IJ SOLN
30.0000 mg | Freq: Once | INTRAMUSCULAR | Status: AC
  Administered 2019-08-22: 13:00:00 30 mg via INTRAVENOUS
  Filled 2019-08-22: qty 1

## 2019-08-22 MED ORDER — DOXYCYCLINE HYCLATE 100 MG PO CAPS: 100.0000 mg | ORAL_CAPSULE | Freq: Two times a day (BID) | ORAL | 0 refills | Status: AC

## 2019-08-22 MED ORDER — DOXYCYCLINE HYCLATE 100 MG PO TABS
100.0000 mg | ORAL_TABLET | Freq: Once | ORAL | Status: AC
  Administered 2019-08-22: 15:00:00 100 mg via ORAL
  Filled 2019-08-22: qty 1

## 2019-08-22 MED ORDER — DEXAMETHASONE SODIUM PHOSPHATE 10 MG/ML IJ SOLN
10.0000 mg | Freq: Once | INTRAMUSCULAR | Status: AC
  Administered 2019-08-22: 15:00:00 10 mg via INTRAVENOUS
  Filled 2019-08-22: qty 1

## 2019-08-22 NOTE — Telephone Encounter (Signed)
Called to Discuss with patient about Covid symptoms and the use of bamlanivimab, a monoclonal antibody infusion for those with mild to moderate Covid symptoms and at a high risk of hospitalization.     Pt is qualified for this infusion at the [facility] due to co-morbid conditions and/or a member of an at-risk group.     Unable to reach pt - left message for patient to return call  

## 2019-08-22 NOTE — ED Triage Notes (Signed)
Pt c/o flu like sx x 1 week-states he had covid test 2 days ago and is awaiting results-NAD-steady gait

## 2019-08-22 NOTE — ED Notes (Signed)
Ambulated from lobby to room 12.  SpO2 96-99% on r/a, HR 92-94.  Denies DOE endorses weakness.  Patient asked to change into a gown.

## 2019-08-22 NOTE — ED Provider Notes (Signed)
[facility] EMERGENCY DEPARTMENT Provider Note   CSN: [id number] Arrival date & time: 08/22/19  1204     History Chief Complaint  Patient presents with  . Cough    Eric Hubbard is a 59 y.o. male.  Pt presents to the ED today with flu like symptoms for a week.  He said he has not had any appetite and feels tired.  He feels weak all over.  Pt saw his pcp and had a Covid test on 08/20/19, but it is not yet back.  Pt denies any fevers.  Some sob.          History reviewed. No pertinent past medical history.  Patient Active Problem List   Diagnosis Date Noted  . Abdominal pain, other specified site 09/30/2012  . Traumatic hematoma of thoracic region 09/30/2012    History reviewed. No pertinent surgical history.     No family history on file.  Social History   Tobacco Use  . Smoking status: Never Smoker  . Smokeless tobacco: Never Used  Vaping Use  . Vaping Use: Never used  Substance Use Topics  . Alcohol use: No  . Drug use: Never    Home Medications Prior to Admission medications   Medication Sig Start Date End Date Taking? Authorizing Provider  calcium carbonate (TUMS - DOSED IN MG ELEMENTAL CALCIUM) 500 MG chewable tablet Chew 2 tablets by mouth as needed for heartburn.    [provider]  doxycycline (VIBRAMYCIN) 100 MG capsule Take 1 capsule (100 mg total) by mouth 2 (two) times daily. 08/22/19   Jacalyn Lefevre, MD  polyethylene glycol (MIRALAX / Ethelene Hal) packet Take 17 g by mouth daily as needed. 10/01/12   Joseph Art, DO    Allergies    Patient has no known allergies.  Review of Systems   Review of Systems  Constitutional: Positive for fatigue.  Neurological: Positive for weakness.  All other systems reviewed and are negative.   Physical Exam Updated Vital Signs BP 124/73   Pulse 75   Temp 98.4 F (36.9 C) (Oral)   Resp 16   Ht 5\' 10"  (1.778 m)   Wt 89.9 kg   SpO2 96%   BMI 28.44 kg/m   Physical Exam Vitals and  nursing note reviewed.  Constitutional:      Appearance: Normal appearance.  HENT:     Head: Normocephalic and atraumatic.     Right Ear: External ear normal.     Left Ear: External ear normal.     Nose: Nose normal.     Mouth/Throat:     Mouth: Mucous membranes are moist.     Pharynx: Oropharynx is clear.  Eyes:     Extraocular Movements: Extraocular movements intact.     Conjunctiva/sclera: Conjunctivae normal.     Pupils: Pupils are equal, round, and reactive to light.  Cardiovascular:     Rate and Rhythm: Normal rate and regular rhythm.     Pulses: Normal pulses.     Heart sounds: Normal heart sounds.  Pulmonary:     Effort: Pulmonary effort is normal.     Breath sounds: Normal breath sounds.  Abdominal:     General: Abdomen is flat. Bowel sounds are normal.     Palpations: Abdomen is soft.  Musculoskeletal:        General: Normal range of motion.     Cervical back: Normal range of motion and neck supple.  Skin:    General: Skin is warm.  Capillary Refill: Capillary refill takes less than 2 seconds.  Neurological:     General: No focal deficit present.     Mental Status: He is alert and oriented to person, place, and time.  Psychiatric:        Mood and Affect: Mood normal.        Behavior: Behavior normal.        Thought Content: Thought content normal.        Judgment: Judgment normal.     ED Results / Procedures / Treatments   Labs (all labs ordered are listed, but only abnormal results are displayed) Labs Reviewed  SARS CORONAVIRUS 2 BY RT PCR (HOSPITAL ORDER, PERFORMED IN [facility]) - Abnormal; Notable for the following components:      Result Value   SARS Coronavirus 2 POSITIVE (*)    All other components within normal limits  COMPREHENSIVE METABOLIC PANEL - Abnormal; Notable for the following components:   Sodium 130 (*)    Chloride 96 (*)    Glucose, Bld 118 (*)    Calcium 8.1 (*)    Albumin 3.1 (*)    All other components within  normal limits  CBC WITH DIFFERENTIAL/PLATELET - Abnormal; Notable for the following components:   Monocytes Absolute 1.1 (*)    All other components within normal limits  URINALYSIS, ROUTINE W REFLEX MICROSCOPIC - Abnormal; Notable for the following components:   Color, Urine AMBER (*)    Specific Gravity, Urine >1.030 (*)    Hgb urine dipstick TRACE (*)    Protein, ur 100 (*)    All other components within normal limits  URINALYSIS, MICROSCOPIC (REFLEX) - Abnormal; Notable for the following components:   Bacteria, UA MANY (*)    All other components within normal limits    EKG None  Radiology DG Chest Portable 1 View  Result Date: 08/22/2019 CLINICAL DATA:  Cough and shortness of breath for the past week. EXAM: PORTABLE CHEST 1 VIEW COMPARISON:  CTA chest dated September 30, 2012. FINDINGS: The heart size and mediastinal contours are within normal limits. Normal pulmonary vascularity. Streaky opacities at the left greater than right lung base. No focal consolidation, pleural effusion, or pneumothorax. No acute osseous abnormality. Mild elevation of the right hemidiaphragm. IMPRESSION: 1. Streaky opacities at the left greater than right lung bases, favor atelectasis. Atypical infection is also a consideration. Electronically Signed   By: Obie Dredge M.D.   On: 08/22/2019 13:45    Procedures Procedures (including critical care time)  Medications Ordered in ED Medications  dexamethasone (DECADRON) injection 10 mg (has no administration in time range)  doxycycline (VIBRA-TABS) tablet 100 mg (has no administration in time range)  ketorolac (TORADOL) 30 MG/ML injection 30 mg (30 mg Intravenous Given 08/22/19 1315)  sodium chloride 0.9 % bolus 1,000 mL (1,000 mLs Intravenous New Bag/Given 08/22/19 1319)    ED Course  I have reviewed the triage vital signs and the nursing notes.  Pertinent labs & imaging results that were available during my care of the patient were reviewed by me  and considered in my medical decision making (see chart for details).    MDM Rules/Calculators/A&P                          Pt is oxygenating well.  He does have a possible pneumonia, so he will be treated with doxy.  Na is slightly low at 130.  He is given 1L of  NS.  He is also given a dose of decadron in the ED.  He has no significant pmhx, but I will refer him to the mab clinic as his bmi is slightly high.    Pt is stable for d/c.  He is instructed to return if worse.  F/u with pcp.  Terris Germano was evaluated in Emergency Department on 08/22/2019 for the symptoms described in the history of present illness. He was evaluated in the context of the global COVID-19 pandemic, which necessitated consideration that the patient might be at risk for infection with the SARS-CoV-2 virus that causes COVID-19. Institutional protocols and algorithms that pertain to the evaluation of patients at risk for COVID-19 are in a state of rapid change based on information released by regulatory bodies including the CDC and federal and state organizations. These policies and algorithms were followed during the patient's care in the ED. Final Clinical Impression(s) / ED Diagnoses Final diagnoses:  Pneumonia due to COVID-19 virus    Rx / DC Orders ED Discharge Orders         Ordered    doxycycline (VIBRAMYCIN) 100 MG capsule  2 times daily     Discontinue  Reprint     08/22/19 1432           Jacalyn Lefevre, MD 08/22/19 1436
